# Patient Record
Sex: Female | Born: 1960 | Race: Black or African American | Hispanic: No | Marital: Married | Smoking: Never smoker
Health system: Southern US, Community
[De-identification: ages and names within clinical notes are randomized; demographics above are authoritative.]

---

## 1998-05-15 ENCOUNTER — Other Ambulatory Visit: Admission: RE | Admit: 1998-05-15 | Discharge: 1998-05-15 | Payer: Self-pay | Admitting: Obstetrics & Gynecology

## 1999-12-01 ENCOUNTER — Other Ambulatory Visit: Admission: RE | Admit: 1999-12-01 | Discharge: 1999-12-01 | Payer: Self-pay | Admitting: Obstetrics and Gynecology

## 2000-06-08 ENCOUNTER — Inpatient Hospital Stay (HOSPITAL_COMMUNITY): Admission: AD | Admit: 2000-06-08 | Discharge: 2000-06-11 | Payer: Self-pay | Admitting: Obstetrics and Gynecology

## 2000-07-07 ENCOUNTER — Other Ambulatory Visit: Admission: RE | Admit: 2000-07-07 | Discharge: 2000-07-07 | Payer: Self-pay | Admitting: Obstetrics and Gynecology

## 2003-02-12 ENCOUNTER — Other Ambulatory Visit: Admission: RE | Admit: 2003-02-12 | Discharge: 2003-02-12 | Payer: Self-pay | Admitting: Obstetrics and Gynecology

## 2005-01-04 ENCOUNTER — Ambulatory Visit: Payer: Self-pay

## 2006-01-23 ENCOUNTER — Encounter: Admission: RE | Admit: 2006-01-23 | Discharge: 2006-01-23 | Payer: Self-pay | Admitting: Obstetrics and Gynecology

## 2007-03-26 ENCOUNTER — Encounter: Admission: RE | Admit: 2007-03-26 | Discharge: 2007-03-26 | Payer: Self-pay | Admitting: Obstetrics and Gynecology

## 2007-03-27 ENCOUNTER — Ambulatory Visit: Payer: Self-pay | Admitting: Sports Medicine

## 2007-03-27 DIAGNOSIS — M779 Enthesopathy, unspecified: Secondary | ICD-10-CM | POA: Insufficient documentation

## 2007-03-27 DIAGNOSIS — M25569 Pain in unspecified knee: Secondary | ICD-10-CM | POA: Insufficient documentation

## 2007-04-05 ENCOUNTER — Encounter: Admission: RE | Admit: 2007-04-05 | Discharge: 2007-04-05 | Payer: Self-pay | Admitting: Obstetrics and Gynecology

## 2007-12-14 ENCOUNTER — Other Ambulatory Visit: Admission: RE | Admit: 2007-12-14 | Discharge: 2007-12-14 | Payer: Self-pay | Admitting: Obstetrics and Gynecology

## 2007-12-24 ENCOUNTER — Encounter: Admission: RE | Admit: 2007-12-24 | Discharge: 2007-12-24 | Payer: Self-pay | Admitting: Obstetrics and Gynecology

## 2007-12-24 IMAGING — MG MM DIAGNOSTIC UNILATERAL R
3 series · 3 of 3 positions shown · non-contrast
Comparison: [DATE], [DATE], [DATE]

CLINICAL DATA: Questioned palpable finding right breast nine
o'clock location.  The patient underwent a cyst aspiration in this
area at her referring physician's office [DATE].  The area
remains palpable.  The patient was previously evaluated [DATE]
which demonstrated a cyst in this approximate location.

DIGITAL DIAGNOSTIC  RIGHT  MAMMOGRAM  WITH CAD AND RIGHT BREAST
ULTRASOUND:

[R CC]
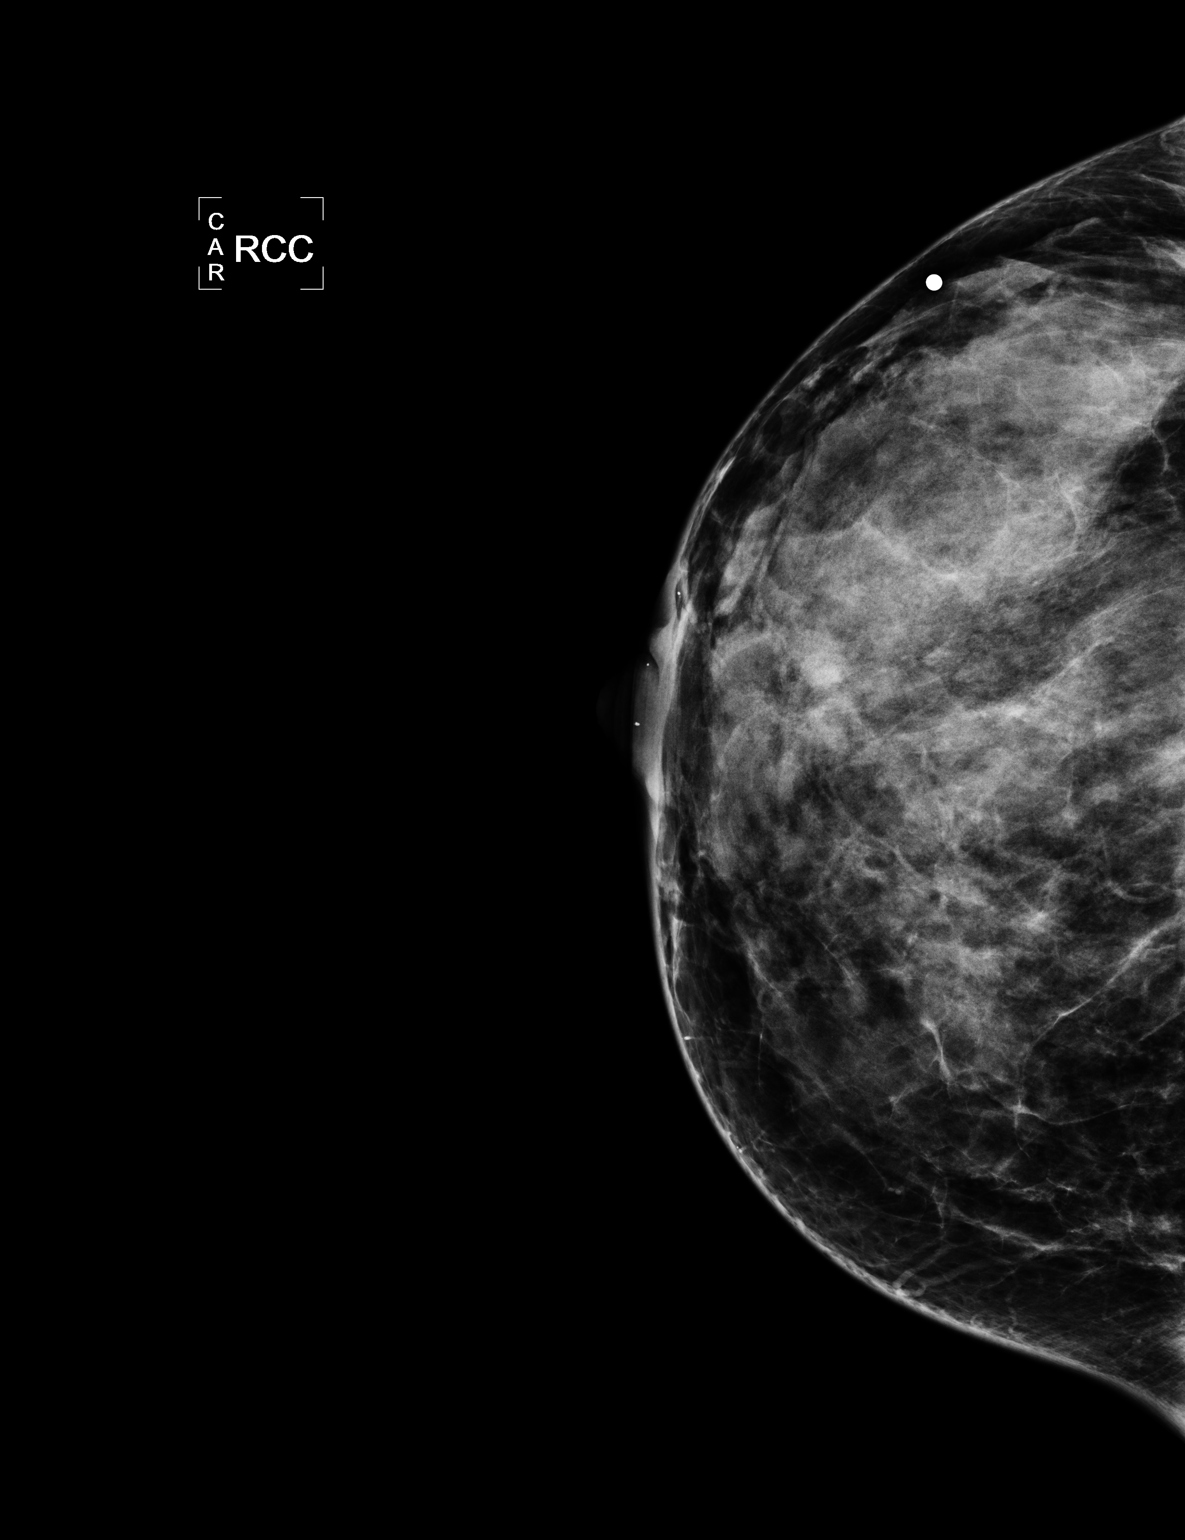

[R MLO]
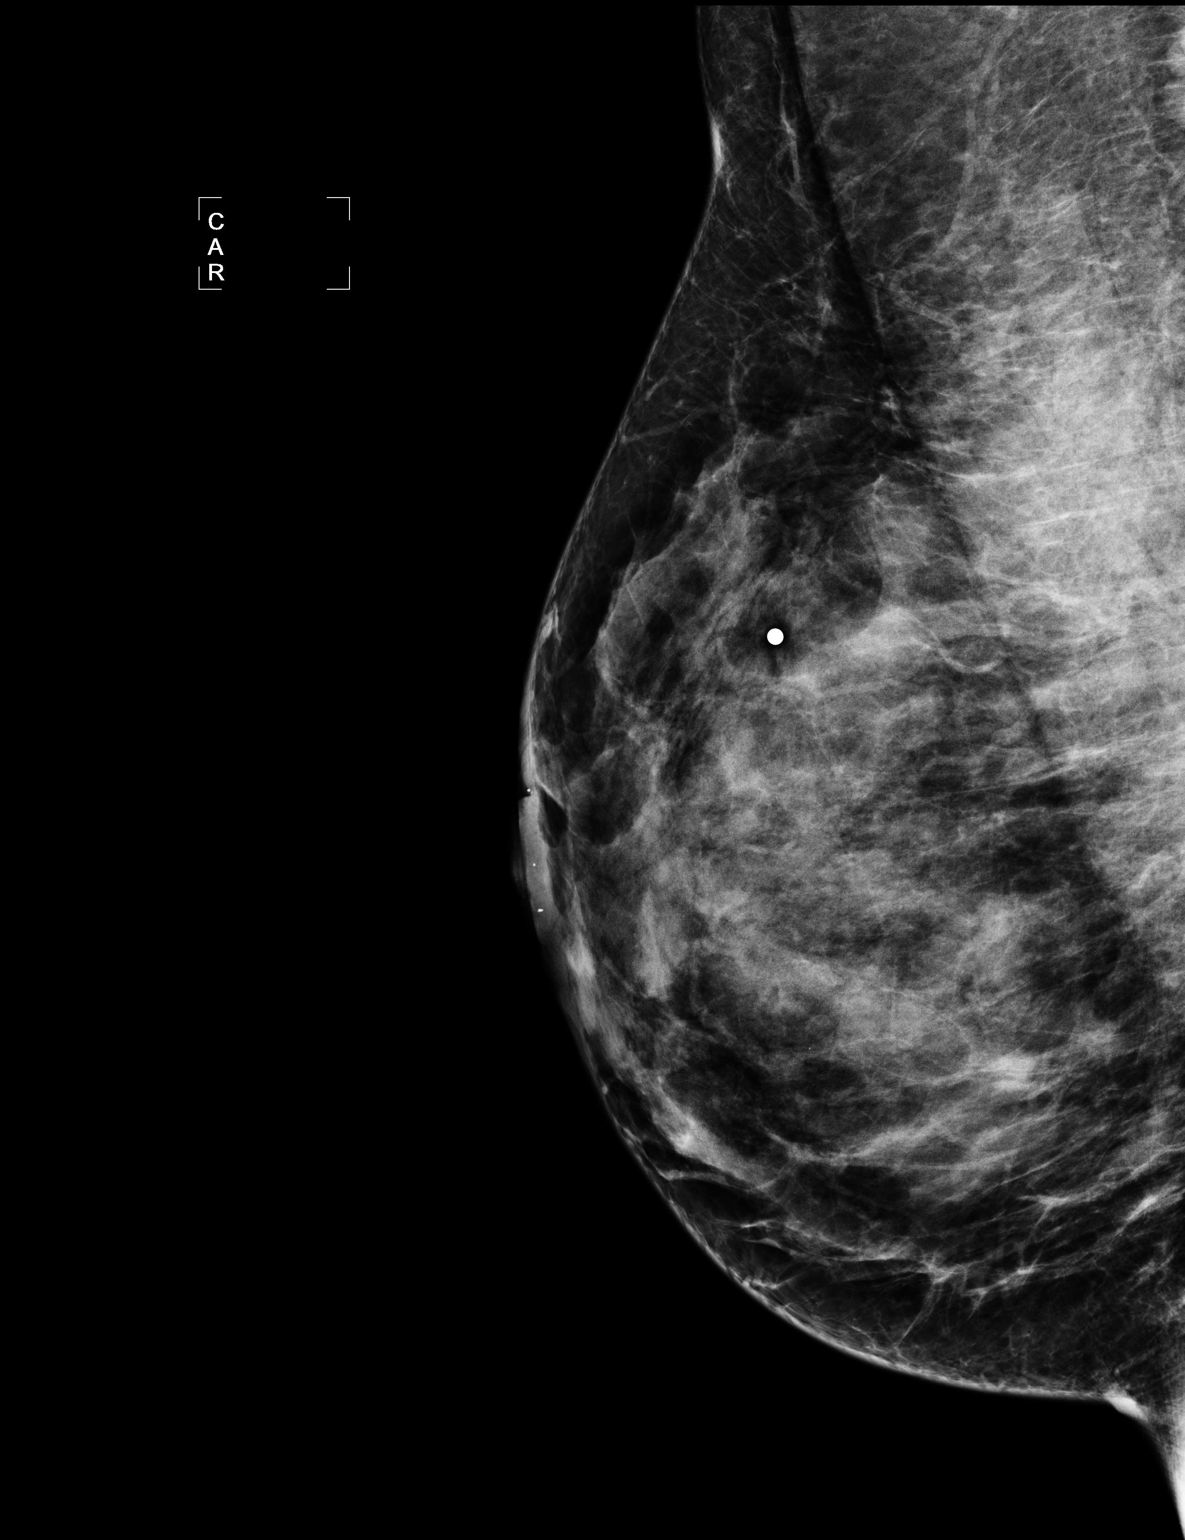

[R TAN]
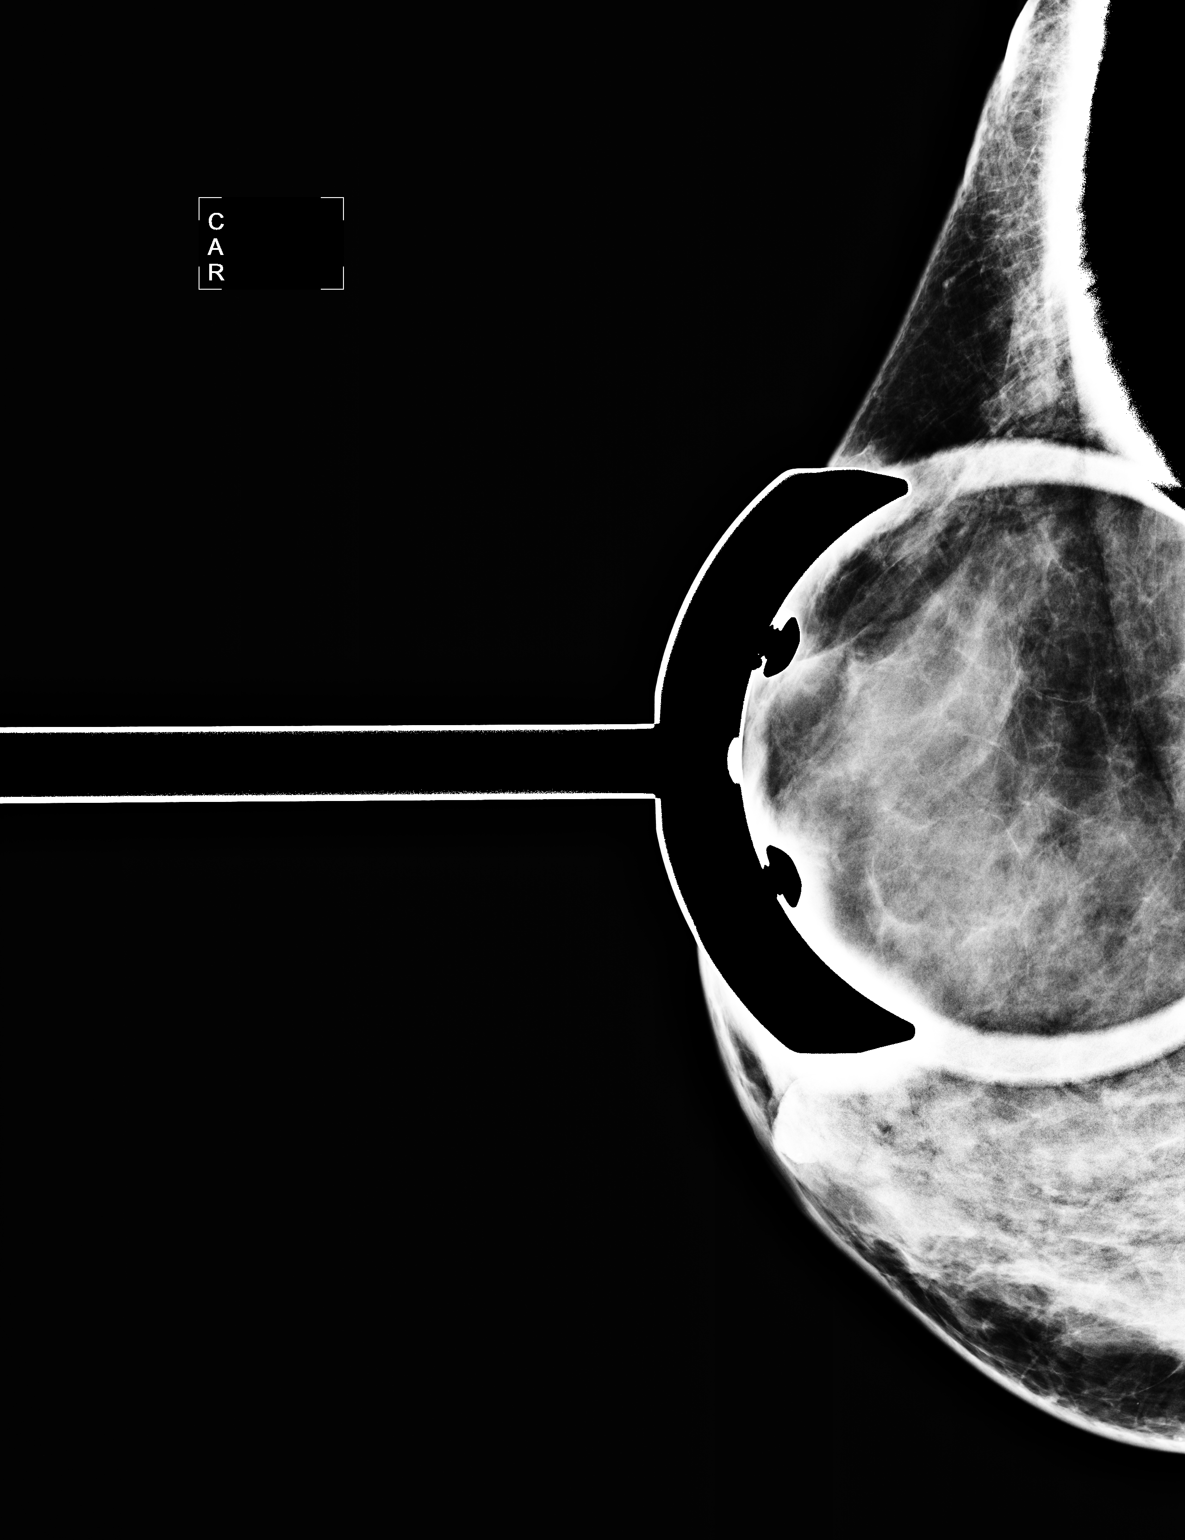

[3 of 3 positions shown; findings below may reference images not displayed]

FINDINGS: The breast parenchyma is extremely dense, which may limit
the sensitivity of mammography.  No suspicious mass, calcification,
or architectural distortion is seen.

On physical exam, I palpate a mobile area of increased density in
the right breast nine o'clock location 5 cm from the nipple.

Ultrasound is performed, showing ill-defined hypo echogenicity with
2 central parallel echogenic lines which may represent the coapted
walls of the previously presumably present cyst. No residual fluid
collection or cyst is identified.
IMPRESSION: Sonographic findings in the right breast nine o'clock location at
the site of the patient's questioned palpable finding most likely
represent resolving inflammation from previous recent cyst
aspiration in this area.  However, this cannot be differentiated
from malignancy with certainty, and follow-up right breast
ultrasound is recommended at the time of the patient's bilateral
mammogram due in [DATE].  This will be done as a diagnostic
examination.

BI-RADS CATEGORY 3:  Probably benign finding(s) - short interval
follow-up suggested.

Recommendation:  Bilateral diagnostic mammography and right breast
ultrasound in [DATE].

Findings and recommendations discussed with the patient and
provided in written form at the time of the exam.

## 2008-03-31 ENCOUNTER — Encounter: Admission: RE | Admit: 2008-03-31 | Discharge: 2008-03-31 | Payer: Self-pay | Admitting: Obstetrics and Gynecology

## 2009-04-01 ENCOUNTER — Encounter: Admission: RE | Admit: 2009-04-01 | Discharge: 2009-04-01 | Payer: Self-pay | Admitting: Obstetrics and Gynecology

## 2010-01-30 ENCOUNTER — Other Ambulatory Visit: Payer: Self-pay | Admitting: Obstetrics and Gynecology

## 2010-01-30 DIAGNOSIS — Z1239 Encounter for other screening for malignant neoplasm of breast: Secondary | ICD-10-CM

## 2010-04-02 ENCOUNTER — Other Ambulatory Visit: Payer: Self-pay | Admitting: Obstetrics and Gynecology

## 2010-04-02 ENCOUNTER — Ambulatory Visit
Admission: RE | Admit: 2010-04-02 | Discharge: 2010-04-02 | Disposition: A | Discharge status: Home / Self Care | Payer: BC Managed Care – PPO | Source: Ambulatory Visit | Attending: Obstetrics and Gynecology | Admitting: Obstetrics and Gynecology

## 2010-04-02 DIAGNOSIS — Z1231 Encounter for screening mammogram for malignant neoplasm of breast: Secondary | ICD-10-CM

## 2010-04-02 DIAGNOSIS — Z1239 Encounter for other screening for malignant neoplasm of breast: Secondary | ICD-10-CM

## 2010-04-05 ENCOUNTER — Ambulatory Visit: Payer: Self-pay

## 2010-05-28 NOTE — Discharge Summary (Signed)
[facility] of [facility]  Patient:    Sandy Franco, Sandy Franco                      MRN: [id number] Adm. Date:  [id number] Disc. Date: [id number] Attending:  Maxie Better                           Discharge Summary  ADMISSION DIAGNOSES:          Previous cesarean section, term gestation.  DISCHARGE DIAGNOSES:          Term gestation, delivered.  PROCEDURE:                    Repeat cesarean section.  HISTORY OF PRESENT ILLNESS:   This is a 50 year old gravida 2, para 1-0-0-1 female with a history of previous cesarean section at 8 cm who presents for elective repeat cesarean section.  Her prenatal course had been uncomplicated.  HOSPITAL COURSE:              The patient was admitted to [facility]. She was taken to the operating room where she underwent a repeat cesarean section via a Kerr hysterotomy.  The findings at the time of surgery was that of a live female, Apgars of 8 and 9, weight 8 pounds 7 ounces.  Normal tubes and ovaries.  Placenta was spontaneous and intact.  She had an uncomplicated postoperative course.  Her CBC on postoperative day showed a hemoglobin 10.3, hematocrit 29.2, platelet count 179,000.  By postoperative day #3 the patient who had remained afebrile was tolerating a regular diet, had had a bowel movement.  Her incision was without any erythema, induration, or exudate. Staples were in place.  DISPOSITION:                  Home.  CONDITION:                    Stable.  DISCHARGE MEDICATIONS:        1. Motrin 800 mg one tablet q.6h. p.r.n. pain.                               2. Prenatal vitamins one p.o. q.d.  FOLLOW-UP:                    Thursday for staple removal, six weeks for postpartum visit.  DISCHARGE INSTRUCTIONS:       Call for temperature greater than or equal to 100.4.  Nothing per vagina for four to six weeks.  No heavy lifting or driving for two weeks.  Call if there is increased incisional pain, redness, or drainage  from the incision site, severe abdominal pain, nausea, vomiting, soaking a regular pad every hour or more frequently. DD:  07/01/00 TD:  07/02/00 Job: [id number]

## 2010-05-28 NOTE — Op Note (Signed)
[facility] of [facility]  Patient:    Sandy Franco, Sandy Franco                      MRN: [id number] Proc. Date: 06/08/00 Adm. Date:  [id number] Attending:  Maxie Better                           Operative Report  PREOPERATIVE DIAGNOSES:       1. Previous cesarean section.                               2. Term gestation.  POSTOPERATIVE DIAGNOSES:      1. Previous cesarean section.                               2. Term gestation.  PROCEDURE:                    Repeat cesarean section Kerr hysterotomy.  SURGEON:                      Sheronette A. Cherly Hensen, M.D.  ASSISTANT:                    Sung Amabile. Roslyn Smiling, M.D.  ANESTHESIA:                   Spinal.  INDICATIONS:                  This is a 50 year old gravida 2, para 1 female at 39+ weeks gestation with a previous cesarean section who now presents for repeat cesarean section.  The risks and benefits of the procedure have been explained to the patient.  She had, after much consideration, declined attempted vaginal delivery.  Consent was signed.  The patient was transferred to the operating room.  DESCRIPTION OF PROCEDURE:     Under adequate spinal anesthesia, the patient was placed in the supine position with a left lateral tilt.  She was sterilely prepped and draped in the usual fashion.  An indwelling Foley catheter had been sterilely placed.  The patients previous Pfannenstiel scar was noted. Marcaine 0.25% was injected along the incision line.  A low transverse incision was then made through the previous scar and carried down to the rectus fascia. The rectus fascia was incised in the midline and extended bilaterally.  The rectus fascia was then bluntly and with careful dissection using cautery and Mayo scissors was dissected off the rectus muscle in superior inferior fashion.  The rectus muscle was split in the midline.  The parietal peritoneum was entered.  The vesicouterine peritoneum was opened. The  bladder was gently dissected off of the lower uterine segment and displaced from the operative field using a Doyen retractor.  A low transverse uterine incision was then made and extended using bandage scissors.  Clear amniotic fluid was noted.  Artificial rupture of membranes of fluid was accomplished with subsequent attempted delivery.  The vertex of the fetus was limited by the large head.  Therefore, the vacuum extractor was subsequently used for delivery of a live female infant who was bulb suctioned on the abdomen.  The cord was clamped and cut.  The baby was transferred to the awaiting pediatricians, who subsequently assigned Apgars of 8 and 9 at one and five  minutes.  The weight of the baby was 8 lb 7 oz.  The placenta was spontaneous and intact.  The uterine cavity was cleaned with of debris.  The uterine incision was inspected and no extension noted.  The uterine incision was closed in two layers, the first being a running lock stitch of 0 Monocryl. The second layer was an imbricating using 0 Monocryl.  An area of bleeding required a figure-of-eight suture, with good hemostasis subsequently noted. Small bleeding along the lower aspect of the uterus and the bladder peritoneum was cauterized.  The pericolic gutters were cleaned of debris.  The abdomen was irrigated and suctioned of debris.  Normal tubes and ovaries were noted bilaterally.  With good hemostasis subsequently noted, the vesicouterine peritoneum of the parietal peritoneum was not closed.  The rectus muscle was inspected and small bleeders cauterized.  The rectus fascia was inspected and closed with 0 Vicryl x 2.  The subcutaneous area was irrigated.  Small bleeders were cauterized.  The subcuticular area was injected with again additional 0.25% Marcaine for a total of 10 cc.  The skin was approximated using Ethicon staples.  SPECIMEN:                     Placenta sent to pathology.  ESTIMATED BLOOD LOSS:         600  cc.  INTRAOPERATIVE FLUID:         3 L of crystalloid.  URINE OUTPUT:                 200 cc of clear yellow urine.  SPONGE AND INSTRUMENT COUNTS: Correct x 2.  COMPLICATIONS:                None.  DISPOSITION:                  The patient tolerated the procedure well and was transferred to the recovery room in stable condition.DD:  06/08/00 TD:  06/08/00 Job: [id number]

## 2010-05-28 NOTE — H&P (Signed)
[facility] of [facility]  Patient:    Sandy Franco, PAIR                        MRN: [id number] Adm. Date:  06/08/00 Attending:  Nena Jordan A. Cherly Hensen, M.D.                         History and Physical  DATE OF BIRTH:                07-Dec-1960  CHIEF COMPLAINT:              Previous cesarean section.  Planned repeat cesarean section.  HISTORY OF PRESENT ILLNESS:   This is a 50 year old gravida 2, para 1-0-0-1 female with a last menstrual period of September 17, 1999 and an [facility] of June 21, 2000 with a past history of cesarean section who is now being admitted at 38+ weeks gestation for a repeat cesarean section.  The patient was counseled regarding attempting vaginal delivery versus a repeat cesarean section.  She has subsequently declined attempted vaginal delivery and now is present for her repeat cesarean section.  Her prenatal care has been uncomplicated.  Her last examination in the office on Jun 02, 2000 revealed that her cervix was 3 cm, 60% effaced, -2, vertex presentation.  Her group B Step culture was negative.  Prenatal care at [facility].  PRENATAL LABORATORY DATA:     Blood type B positive.  Antibody screen negative.  RPR nonreactive.  Rubella immune.  Hepatitis B surface antigen negative.  HIV test negative.  GC and Chlamydia cultures negative.  Pap within normal limits.  The patient had declined amniocentesis.  AFP-3 test was normal.  Normal anatomic fetal survey on February 01, 2000, which was concordant with her last menstrual period.  One-hour GTT was normal.  Group B Strep culture negative.  ALLERGIES:                    No known drug allergies.  MEDICATIONS:                  Prenatal vitamins.  PAST MEDICAL HISTORY:         Mitral valve prolapse.  PAST SURGICAL HISTORY:        Cesarean section in April 1996, 7 lb 8 oz baby, low transverse uterine incision.  FAMILY HISTORY:               Mother partial thyroidectomy.  Sister  had thyroid cancer and, therefore, had a thyroidectomy.  Sister with a history of depression.  Maternal grandmother with a history of depression.  Father is a recovered alcoholic.  SOCIAL HISTORY:               She is married.  Nonsmoker.  Her husband is a Land.  REVIEW OF SYSTEMS:            Negative except as noted in the history of present illness.  PHYSICAL EXAMINATION:  GENERAL:                      Well-developed, well-nourished gravid female in no acute distress.  VITAL SIGNS:                  Blood pressure 120/70, weight 189 lb.  SKIN:  No lesions.  HEENT:                        Anicteric sclerae.  Pink conjunctivae. Oropharynx negative.  HEART:                        Regular rate and rhythm without murmurs.  LUNGS:                        Clear to auscultation.  BREASTS:                      Soft and nontender.  No palpable mass.  ABDOMEN:                      Gravid.  Fundal height 38 cm.  Low transverse incision.  PELVIC:                       The cervix is 3, 60%, -2, vertex.  IMPRESSION:                   1. Term gestation.                               2. Previous cesarean section.  PLAN:                         1. Admission.                               2. Repeat cesarean section.                               3. Antibiotic prophylaxis for a history of                                  mitral valve prolapse.                               4. Spinal anesthesia.                               5. Routine admission orders and laboratory data.                               6. The risks and benefits of the procedure have                                  been explained to the patient and her husband                                  including but not limited to infection,  bleeding, injury to surrounding organ                                  structures (such as bladder, ureter or                                   bowels), internal scar tissue, the possible                                  need for cesarean section in the future.  All                                  questions were answered. DD:  06/07/00 TD:  06/07/00 Job: [id number]

## 2010-12-19 ENCOUNTER — Ambulatory Visit: Payer: Self-pay

## 2010-12-19 DIAGNOSIS — Z23 Encounter for immunization: Secondary | ICD-10-CM

## 2011-04-04 ENCOUNTER — Ambulatory Visit: Payer: BC Managed Care – PPO

## 2012-06-18 IMAGING — MG MM DIGITAL SCREENING BILATERAL
4 series · 4 of 4 positions shown · non-contrast
Comparison: Prior studies.

DG SCREEN MAMMOGRAM BILATERAL
Bilateral CC and MLO view(s) were taken.

DIGITAL SCREENING MAMMOGRAM WITH CAD:

[R CC]
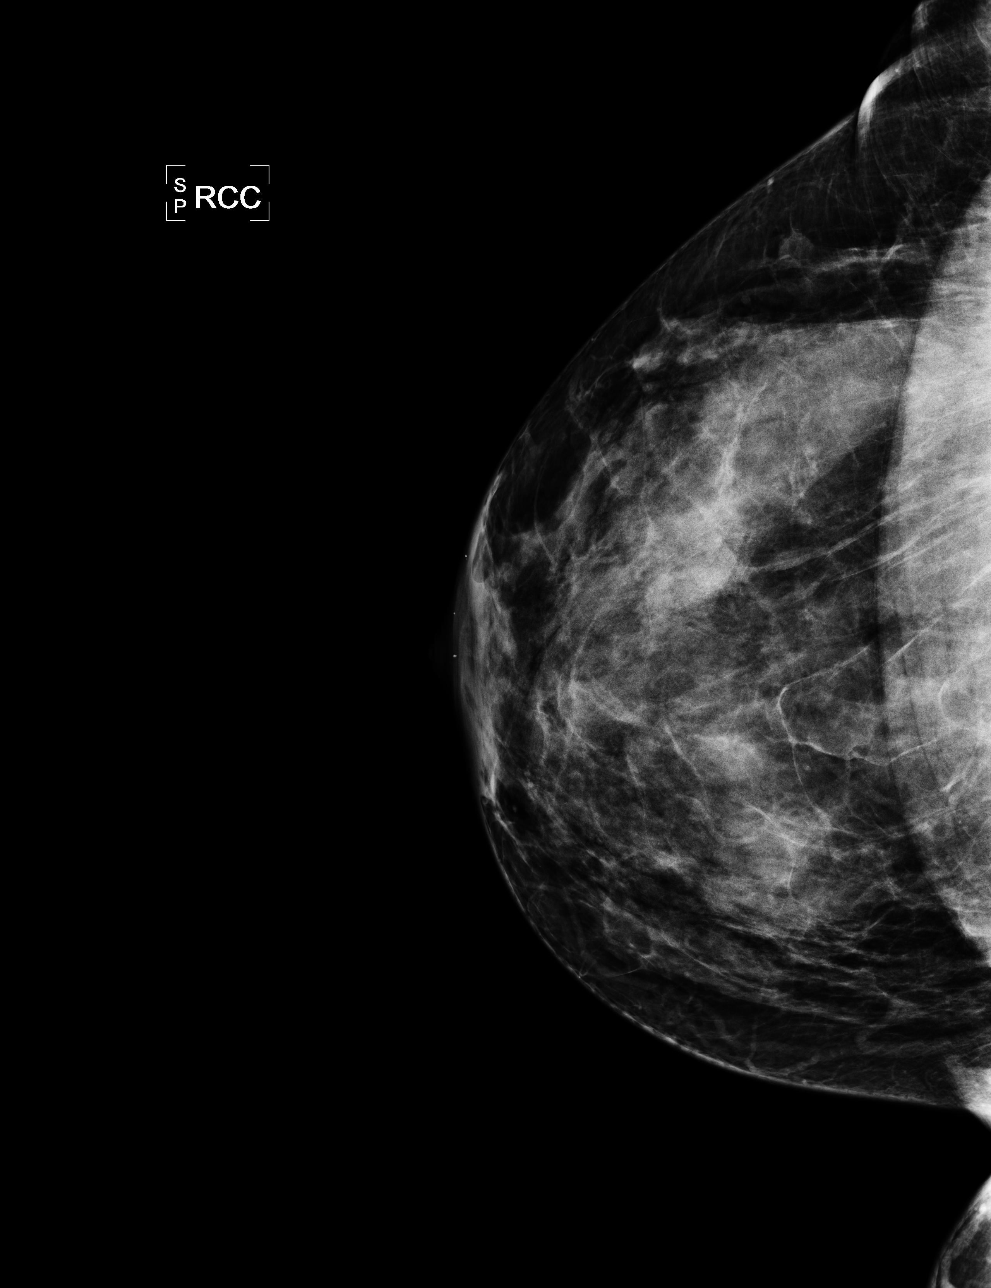

[L CC]
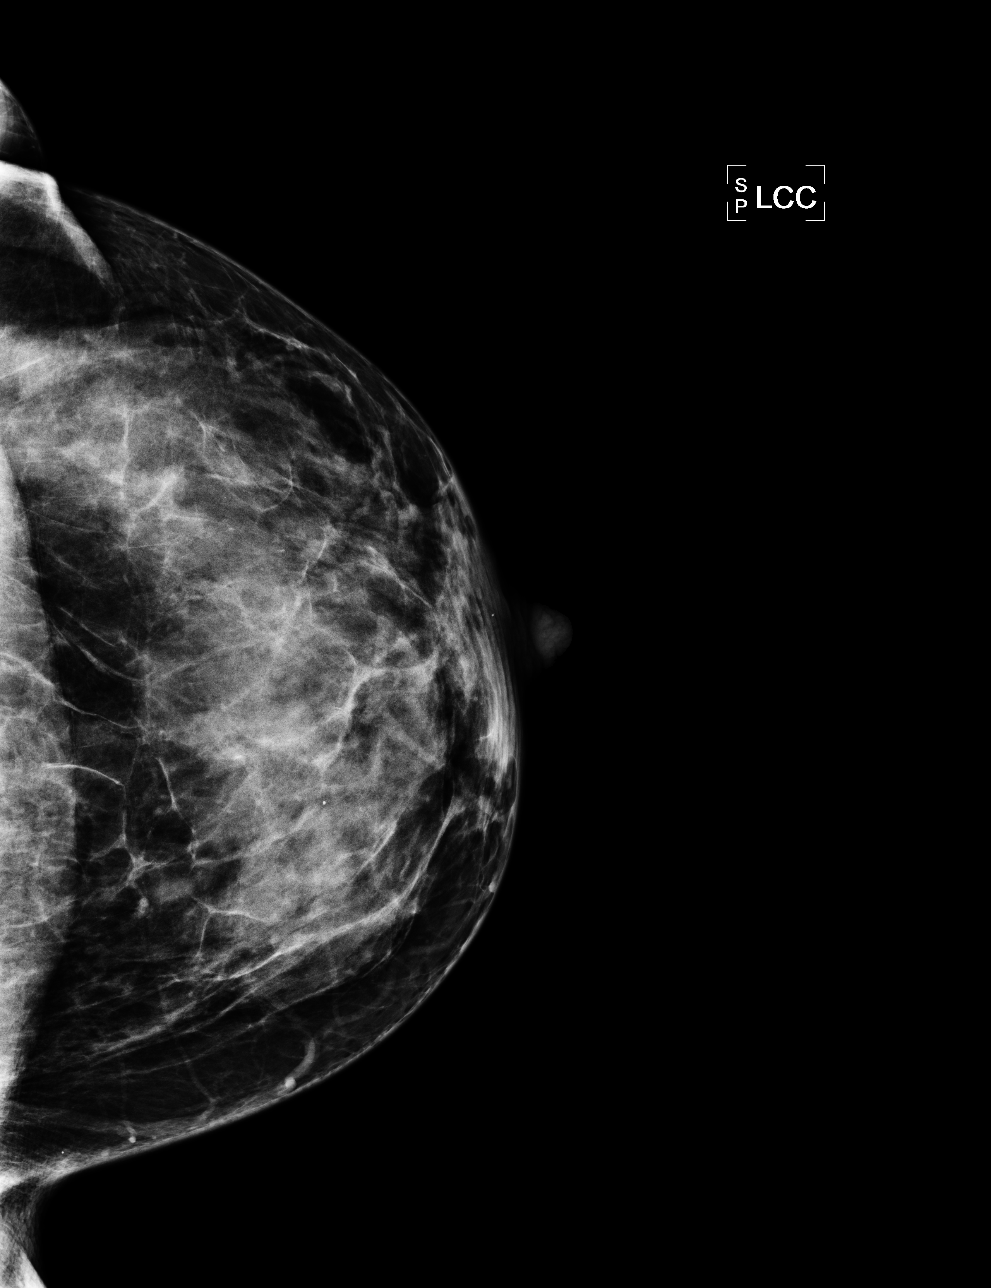

[L MLO]
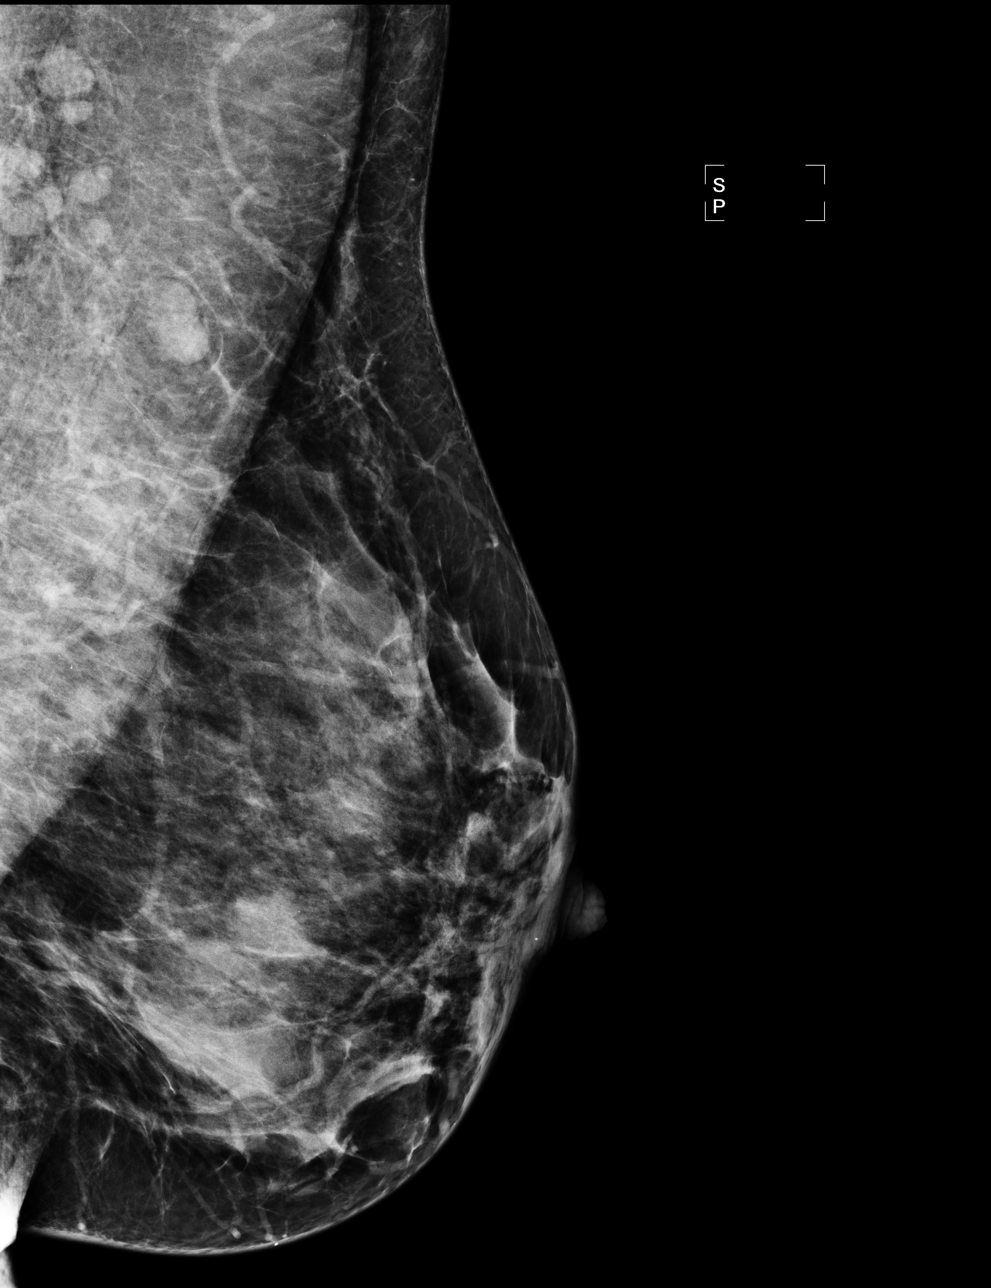

[R MLO]
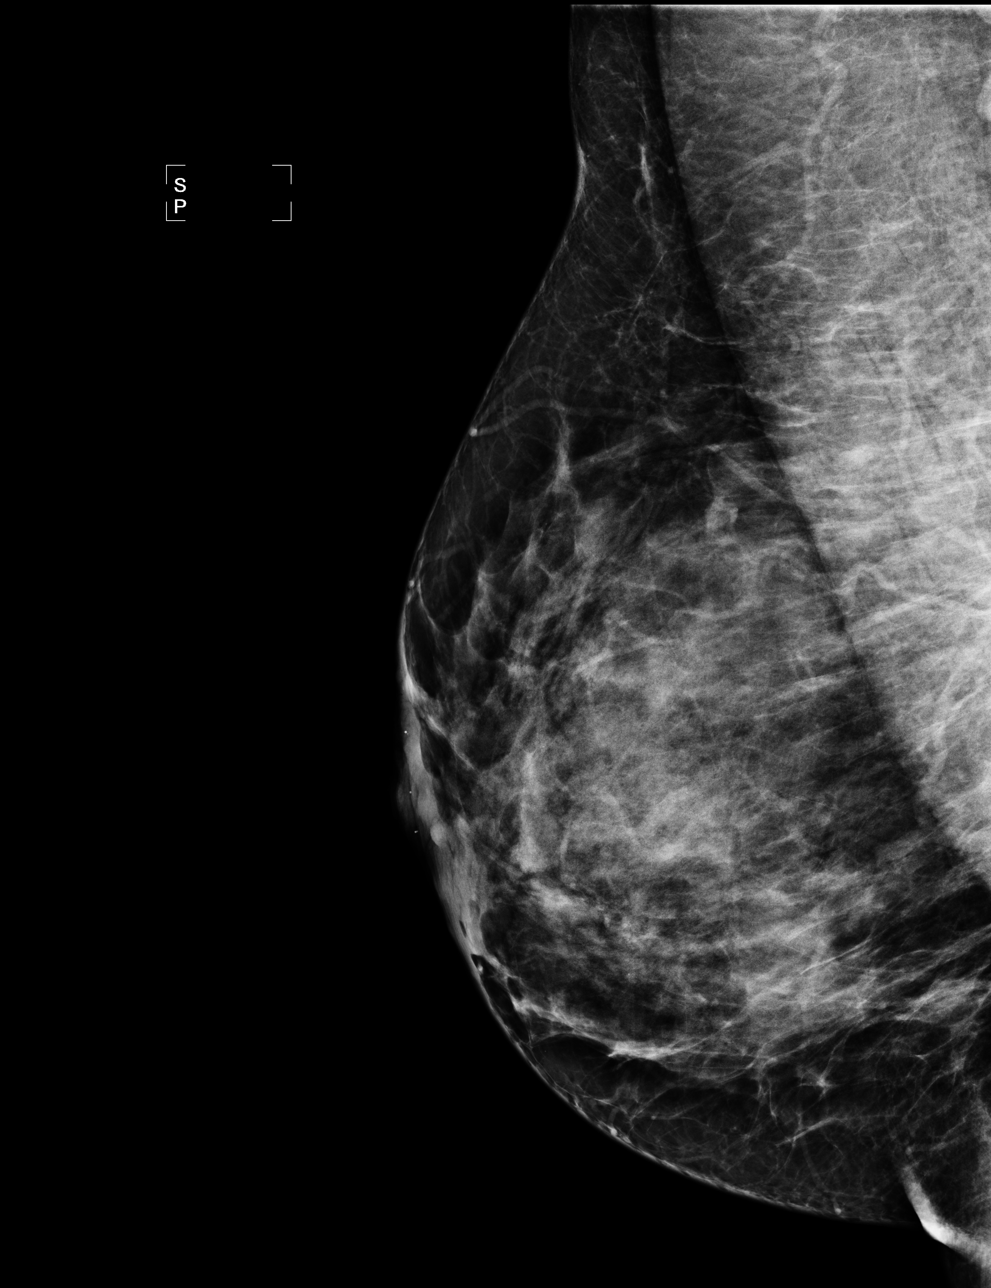

[4 of 4 positions shown; findings below may reference images not displayed]

The breast tissue is heterogeneously dense.  There is no dominant mass, architectural distortion or
calcification to suggest malignancy.

Images were processed with CAD.
IMPRESSION: No mammographic evidence of malignancy.  Suggest yearly screening mammography.

A result letter of this screening mammogram will be mailed directly to the patient.

ASSESSMENT: Negative - BI-RADS 1

Screening mammogram in 1 year.
,

## 2015-10-06 ENCOUNTER — Telehealth: Payer: Self-pay | Admitting: Cardiovascular Disease

## 2015-10-06 NOTE — Telephone Encounter (Signed)
[facility] for apt on 10/23/15 with Dr Allyson SabalBerry. Records given to [employer] (medical records) CN

## 2015-10-07 ENCOUNTER — Ambulatory Visit (INDEPENDENT_AMBULATORY_CARE_PROVIDER_SITE_OTHER): Payer: BLUE CROSS/BLUE SHIELD | Admitting: Interventional Cardiology

## 2015-10-07 VITALS — BP 120/80 | HR 69 | Ht 66.5 in

## 2015-10-07 DIAGNOSIS — I340 Nonrheumatic mitral (valve) insufficiency: Secondary | ICD-10-CM

## 2015-10-07 NOTE — Patient Instructions (Addendum)
Medication Instructions:  Your physician recommends that you continue on your current medications as directed. Please refer to the Current Medication list given to you today.   Labwork: None ordered  Testing/Procedures: Your physician has requested that you have an echocardiogram. Echocardiography is a painless test that uses sound waves to create images of your heart. It provides your doctor with information about the size and shape of your heart and how well your heart's chambers and valves are working. This procedure takes approximately one hour. There are no restrictions for this procedure.    Follow-Up: You have a follow up appointment scheduled on 10/29/15 @ 12pm  Any Other Special Instructions Will Be Listed Below (If Applicable).     If you need a refill on your cardiac medications before your next appointment, please call your pharmacy.

## 2015-10-07 NOTE — Progress Notes (Signed)
Cardiology Office Note    Date:  10/07/2015   ID:  Sandy Franco, DOB 07-26-1960, MRN [id number]  PCP:  Loney Laurence, MD  Cardiologist: Lesleigh Noe, MD   Chief Complaint  Patient presents with  . Heart Murmur    History of Present Illness:  Sandy Franco is a 55 y.o. female with history of mitral valve prolapse syndrome for evaluation of cardiac murmur.  She is active. 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 She has not competitively trained in over 7 years. She is in the process of getting back into training for competitive events. She noted that she was not getting into condition as quickly as she thought she should. She didn't think much of it and felt it was related to the time lapse between her last period of competitive activity. She denies orthopnea, PND, chest pain, and syncope. She saw her gynecologist recently and was referred for cardiac evaluation because of a change in her heart murmur  Her diagnoses of mitral valve prolapse is vague. She has seen Dr. Arleta Franco for greater than 15 years ago. She doesn't recall his particular diagnosis. 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    No past medical history on file.  Past Surgical History:  Procedure Laterality Date  . CESAREAN SECTION  2002, 1996   X2    Current Medications: No outpatient prescriptions prior to visit.   No facility-administered medications prior to visit.      Allergies:   Penicillins   Social History   Social History  . Marital status: Married    Spouse name: N/A  . Number of children: N/A  . Years of education: N/A   Social History Main Topics  . Smoking status: Never Smoker  . Smokeless tobacco: Never Used  . Alcohol use None  . Drug use: Unknown  . Sexual activity: Yes   Other Topics Concern  . None   Social History Narrative  . None     Family History:  The patient's family  history includes Heart disease in her maternal grandfather; Thyroid disease in her mother and sister.   ROS:   Please see the history of present illness.    Irregular heartbeat  All other systems reviewed and are negative.   PHYSICAL EXAM:   VS:  BP 120/80   Pulse 69   Ht 5' 6.5" (1.689 m)    GEN: Well nourished, well developed, in no acute distress  HEENT: normal  Neck: no JVD, carotid bruits, or masses Cardiac: RRR, rubs, no edema . There is a loud late peaking holosystolic murmur heard at the right upper sternal border, the left mid and lower sternal border and into the axilla. Systolic murmur can be heard and to both carotids. No gallop is heard. Respiratory:  clear to auscultation bilaterally, normal work of breathing GI: soft, nontender, nondistended, + BS MS: no deformity or atrophy  Skin: warm and dry, no rash Neuro:  Alert and Oriented x 3, Strength and sensation are intact Psych: euthymic mood, full affect  Wt Readings from Last 3 Encounters:  03/27/07 158 lb 5 oz (71.8 kg)      Studies/Labs Reviewed:   EKG:  EKG  Normal sinus rhythm, prominent voltage, nonspecific T wave abnormality, left atrial abnormality.  Recent Labs: No results found for requested labs within last 8760 hours.   Lipid Panel No results  found for: CHOL, TRIG, HDL, CHOLHDL, VLDL, LDLCALC, LDLDIRECT  Additional studies/ records that were reviewed today include:  No prior cardiac data is available.    ASSESSMENT:    1. Mitral regurgitation      PLAN:  In order of problems listed above:  1. Exam is impressive. It is consistent with mitral regurgitation and I'm concerned that may also be aortic valve involvement with stenosis. Will have a 2-D Doppler echocardiogram performed to document the status of the mitral valve which I think has significant regurgitation. We'll also evaluate aortic valve and exclude the possibility of dynamic left ventricular outflow tract obstruction. Clinical  follow-up in one month to further discuss and plan clinical follow-up.    Medication Adjustments/Labs and Tests Ordered: Current medicines are reviewed at length with the patient today.  Concerns regarding medicines are outlined above.  Medication changes, Labs and Tests ordered today are listed in the Patient Instructions below. There are no Patient Instructions on file for this visit.   Signed, Lesleigh NoeHenry W Smith III, MD  10/07/2015 3:10 PM    [facility] [address], [place], [place]  [address] Phone: [phone]; Fax: [phone]

## 2015-10-23 ENCOUNTER — Ambulatory Visit: Payer: Self-pay | Admitting: Cardiovascular Disease

## 2015-10-26 ENCOUNTER — Other Ambulatory Visit: Payer: Self-pay

## 2015-10-26 ENCOUNTER — Ambulatory Visit (HOSPITAL_COMMUNITY): Payer: BLUE CROSS/BLUE SHIELD | Attending: Cardiology

## 2015-10-26 DIAGNOSIS — R011 Cardiac murmur, unspecified: Secondary | ICD-10-CM | POA: Insufficient documentation

## 2015-10-26 DIAGNOSIS — I517 Cardiomegaly: Secondary | ICD-10-CM | POA: Diagnosis not present

## 2015-10-26 DIAGNOSIS — I340 Nonrheumatic mitral (valve) insufficiency: Secondary | ICD-10-CM | POA: Diagnosis not present

## 2015-10-26 DIAGNOSIS — I071 Rheumatic tricuspid insufficiency: Secondary | ICD-10-CM | POA: Insufficient documentation

## 2015-10-26 DIAGNOSIS — I5189 Other ill-defined heart diseases: Secondary | ICD-10-CM | POA: Diagnosis not present

## 2015-10-26 DIAGNOSIS — I341 Nonrheumatic mitral (valve) prolapse: Secondary | ICD-10-CM | POA: Insufficient documentation

## 2015-10-27 ENCOUNTER — Encounter: Payer: Self-pay | Admitting: Interventional Cardiology

## 2015-10-29 ENCOUNTER — Ambulatory Visit (INDEPENDENT_AMBULATORY_CARE_PROVIDER_SITE_OTHER): Payer: BLUE CROSS/BLUE SHIELD | Admitting: Interventional Cardiology

## 2015-10-29 ENCOUNTER — Encounter: Payer: Self-pay | Admitting: Interventional Cardiology

## 2015-10-29 VITALS — BP 120/80 | HR 70 | Ht 66.0 in | Wt 180.8 lb

## 2015-10-29 DIAGNOSIS — I340 Nonrheumatic mitral (valve) insufficiency: Secondary | ICD-10-CM | POA: Diagnosis not present

## 2015-10-29 NOTE — Patient Instructions (Signed)
Medication Instructions:  Your physician recommends that you continue on your current medications as directed. Please refer to the Current Medication list given to you today.   Labwork: None ordered  Testing/Procedures: Your physician has requested that you have an echocardiogram. Echocardiography is a painless test that uses sound waves to create images of your heart. It provides your doctor with information about the size and shape of your heart and how well your heart's chambers and valves are working. This procedure takes approximately one hour. There are no restrictions for this procedure. ( To be scheduled in 6 months)  Follow-Up: Your physician wants you to follow-up in: 6 months with Dr.Smith You will receive a reminder letter in the mail two months in advance. If you don't receive a letter, please call our office to schedule the follow-up appointment.   Any Other Special Instructions Will Be Listed Below (If Applicable).     If you need a refill on your cardiac medications before your next appointment, please call your pharmacy.

## 2015-10-29 NOTE — Progress Notes (Signed)
Cardiology Office Note    Date:  10/29/2015   ID:  Sandy Franco, DOB May 02, 1960, MRN [id number]  PCP:  Loney Laurence, MD  Cardiologist: Lesleigh Noe, MD   Chief Complaint  Patient presents with  . Cardiac Valve Problem    History of Present Illness:  Sandy Franco is a 55 y.o. female follow-up of degenerative mitral valve disease with moderate to severe mitral regurgitation.  The echocardiogram demonstrates significant mitral regurgitation. The patient and the husband who also has had mitral valve regurgitation/repair/valve replacement or here to discuss the findings. The patient is having some limitations in exertional tolerance with heavy physical activity. She feels her average activities of daily living are unimpaired. She is also able to train for cross fit participation but feels that she has a wall and becomes a little more short of breath and she used to several years ago. She denies orthopnea and PND. Please see the echocardiogram results below.  She specifically denies chest discomfort, prolonged palpitations, orthopnea, PND, and leg edema. She feels her quality of life currently is excellent. She has no limitations other than with extreme aerobic activity.  History reviewed. No pertinent past medical history.  Past Surgical History:  Procedure Laterality Date  . CESAREAN SECTION  2002, 1996   X2    Current Medications: No outpatient prescriptions prior to visit.   No facility-administered medications prior to visit.      Allergies:   Penicillins   Social History   Social History  . Marital status: Married    Spouse name: N/A  . Number of children: N/A  . Years of education: N/A   Social History Main Topics  . Smoking status: Never Smoker  . Smokeless tobacco: Never Used  . Alcohol use None  . Drug use: Unknown  . Sexual activity: Yes   Other Topics Concern  . None   Social History Narrative  . None     Family History:  The  patient's family history includes Heart disease in her maternal grandfather; Thyroid disease in her mother and sister.   ROS:   Please see the history of present illness.    Back pain, anxiety.  All other systems reviewed and are negative.   PHYSICAL EXAM:   VS:  BP 120/80   Pulse 70   Ht 5\' 6"  (1.676 m)   Wt 180 lb 12.8 oz (82 kg)   SpO2 99%   BMI 29.18 kg/m    GEN: Well nourished, well developed, in no acute distress  HEENT: normal  Neck: no JVD, carotid bruits, or masses Cardiac: RRR; 4/6 holosystolic murmur of MR. No gallop is heard. There is no edema.  Respiratory:  clear to auscultation bilaterally, normal work of breathing GI: soft, nontender, nondistended, + BS MS: no deformity or atrophy  Skin: warm and dry, no rash Neuro:  Alert and Oriented x 3, Strength and sensation are intact Psych: euthymic mood, full affect  Wt Readings from Last 3 Encounters:  10/29/15 180 lb 12.8 oz (82 kg)  03/27/07 158 lb 5 oz (71.8 kg)      Studies/Labs Reviewed:   EKG:  EKG  Not repeated.  Recent Labs: No results found for requested labs within last 8760 hours.   Lipid Panel No results found for: CHOL, TRIG, HDL, CHOLHDL, VLDL, LDLCALC, LDLDIRECT  Additional studies/ records that were reviewed today include:  Echocardiogram 10/26/15: ------------------------------------------------------------------- Study Conclusions  - Left ventricle: The cavity size was normal. There was  mild focal   basal hypertrophy of the septum. Systolic function was normal.   The estimated ejection fraction was in the range of 55% to 60%.   Wall motion was normal; there were no regional wall motion   abnormalities. Features are consistent with a pseudonormal left   ventricular filling pattern, with concomitant abnormal relaxation   and increased filling pressure (grade 2 diastolic dysfunction).   Doppler parameters are consistent with high ventricular filling   pressure. - Mitral valve: Severe  prolapse, involving the posterior leaflet.   There was severe regurgitation directed eccentrically and   anteriorly. - Left atrium: The atrium was moderately to severely dilated. - Pulmonary arteries: Systolic pressure was mildly increased. PA   peak pressure: 32 mm Hg (S).  Impressions:  - Normal LV systolic function; grade 2 diastolic dysfunction;   severe prolapse of posterior MV leaflet with severe, eccentric,   anteriorly directed MR; moderate to severe LAE; trace TR with   mildly elevated pulmonary pressure; suggest TEE to better assess   MV and MR.   ASSESSMENT:    1. Moderate to severe mitral regurgitation      PLAN:  In order of problems listed above:  1. Degenerative mitral valve disease with moderate to severe mitral regurgitation, ordered to severe left atrial enlargement, preserved LV size and function. Patient has only perceive limitations at very high levels of aerobic exercise. She is currently happy with her quality of life. We discussed cutting back on isometric physical activity. She will have an echocardiogram repeated in 6 months to determine stability. She is cautioned to call if any prolonged tachycardia/palpitations, chest pain, sudden dyspnea, or change in exertional tolerance as noted on the prior exam.  Greater than 50% of the office visit was spent in patient education and discussion of natural history and potential treatment options.    Medication Adjustments/Labs and Tests Ordered: Current medicines are reviewed at length with the patient today.  Concerns regarding medicines are outlined above.  Medication changes, Labs and Tests ordered today are listed in the Patient Instructions below. Patient Instructions  Medication Instructions:  Your physician recommends that you continue on your current medications as directed. Please refer to the Current Medication list given to you today.   Labwork: None ordered  Testing/Procedures: Your physician has  requested that you have an echocardiogram. Echocardiography is a painless test that uses sound waves to create images of your heart. It provides your doctor with information about the size and shape of your heart and how well your heart's chambers and valves are working. This procedure takes approximately one hour. There are no restrictions for this procedure. ( To be scheduled in 6 months)  Follow-Up: Your physician wants you to follow-up in: 6 months with Dr.Seraphim Affinito You will receive a reminder letter in the mail two months in advance. If you don't receive a letter, please call our office to schedule the follow-up appointment.   Any Other Special Instructions Will Be Listed Below (If Applicable).     If you need a refill on your cardiac medications before your next appointment, please call your pharmacy.      Signed, Lesleigh NoeHenry W Mafalda Mcginniss III, MD  10/29/2015 1:44 PM    [facility] [address], [place], [place]  [address] Phone: [phone]; Fax: [phone]

## 2015-11-27 ENCOUNTER — Ambulatory Visit: Payer: Self-pay | Admitting: Interventional Cardiology

## 2016-04-11 ENCOUNTER — Encounter: Payer: Self-pay | Admitting: Interventional Cardiology

## 2016-04-19 ENCOUNTER — Other Ambulatory Visit (HOSPITAL_COMMUNITY): Payer: BLUE CROSS/BLUE SHIELD

## 2019-02-22 ENCOUNTER — Ambulatory Visit: Payer: BLUE CROSS/BLUE SHIELD

## 2019-03-19 ENCOUNTER — Ambulatory Visit: Payer: BLUE CROSS/BLUE SHIELD | Attending: Internal Medicine

## 2019-03-19 DIAGNOSIS — Z23 Encounter for immunization: Secondary | ICD-10-CM

## 2019-03-19 NOTE — Progress Notes (Signed)
   Covid-19 Vaccination Clinic  Name:  ALAJIAH DUTKIEWICZ    MRN: [id number] DOB: [birth year]  03/19/2019  Ms. Riles was observed post Covid-19 immunization for 15 minutes without incident. She was provided with Vaccine Information Sheet and instruction to access the V-Safe system.   Ms. Whitmyer was instructed to call 911 with any severe reactions post vaccine: Marland Kitchen Difficulty breathing  . Swelling of face and throat  . A fast heartbeat  . A bad rash all over body  . Dizziness and weakness   Immunizations Administered    Name Date Dose VIS Date Route   Pfizer COVID-19 Vaccine 03/19/2019  2:40 PM 0.3 mL 12/21/2018 Intramuscular   Manufacturer: [employer], [employer]   Lot: [id number]   NDC: [address]-1

## 2019-04-09 ENCOUNTER — Ambulatory Visit: Payer: BLUE CROSS/BLUE SHIELD | Attending: Internal Medicine

## 2019-04-09 DIAGNOSIS — Z23 Encounter for immunization: Secondary | ICD-10-CM

## 2019-04-09 NOTE — Progress Notes (Signed)
   Covid-19 Vaccination Clinic  Name:  Sandy Franco    MRN: [id number] DOB: 1960/11/21  04/09/2019  Sandy Franco was observed post Covid-19 immunization for 15 minutes without incident. She was provided with Vaccine Information Sheet and instruction to access the V-Safe system.   Sandy Franco was instructed to call 911 with any severe reactions post vaccine: Marland Kitchen Difficulty breathing  . Swelling of face and throat  . A fast heartbeat  . A bad rash all over body  . Dizziness and weakness   Immunizations Administered    Name Date Dose VIS Date Route   Pfizer COVID-19 Vaccine 04/09/2019 10:43 AM 0.3 mL 12/21/2018 Intramuscular   Manufacturer: [employer], [employer]   Lot: [id number]   NDC: [address]-1

## 2021-09-15 DIAGNOSIS — Z124 Encounter for screening for malignant neoplasm of cervix: Secondary | ICD-10-CM | POA: Diagnosis not present

## 2021-11-08 DIAGNOSIS — Z952 Presence of prosthetic heart valve: Secondary | ICD-10-CM | POA: Diagnosis not present

## 2021-11-08 DIAGNOSIS — Z7902 Long term (current) use of antithrombotics/antiplatelets: Secondary | ICD-10-CM | POA: Diagnosis not present

## 2021-11-08 DIAGNOSIS — R001 Bradycardia, unspecified: Secondary | ICD-10-CM | POA: Diagnosis not present

## 2021-11-08 DIAGNOSIS — I340 Nonrheumatic mitral (valve) insufficiency: Secondary | ICD-10-CM | POA: Diagnosis not present

## 2021-11-08 DIAGNOSIS — R002 Palpitations: Secondary | ICD-10-CM | POA: Diagnosis not present

## 2021-11-08 DIAGNOSIS — I10 Essential (primary) hypertension: Secondary | ICD-10-CM | POA: Diagnosis not present

## 2021-11-30 DIAGNOSIS — I340 Nonrheumatic mitral (valve) insufficiency: Secondary | ICD-10-CM | POA: Diagnosis not present

## 2022-04-13 DIAGNOSIS — L718 Other rosacea: Secondary | ICD-10-CM | POA: Diagnosis not present

## 2022-04-13 DIAGNOSIS — H0288A Meibomian gland dysfunction right eye, upper and lower eyelids: Secondary | ICD-10-CM | POA: Diagnosis not present

## 2022-04-13 DIAGNOSIS — H0288B Meibomian gland dysfunction left eye, upper and lower eyelids: Secondary | ICD-10-CM | POA: Diagnosis not present

## 2022-04-13 DIAGNOSIS — H2513 Age-related nuclear cataract, bilateral: Secondary | ICD-10-CM | POA: Diagnosis not present

## 2022-08-23 DIAGNOSIS — Z1211 Encounter for screening for malignant neoplasm of colon: Secondary | ICD-10-CM | POA: Diagnosis not present

## 2022-10-03 DIAGNOSIS — Z1231 Encounter for screening mammogram for malignant neoplasm of breast: Secondary | ICD-10-CM | POA: Diagnosis not present

## 2022-10-03 DIAGNOSIS — Z01419 Encounter for gynecological examination (general) (routine) without abnormal findings: Secondary | ICD-10-CM | POA: Diagnosis not present

## 2022-10-17 DIAGNOSIS — Z0000 Encounter for general adult medical examination without abnormal findings: Secondary | ICD-10-CM | POA: Diagnosis not present

## 2022-11-28 DIAGNOSIS — I340 Nonrheumatic mitral (valve) insufficiency: Secondary | ICD-10-CM | POA: Diagnosis not present
